# Patient Record
Sex: Male | Born: 1980 | Race: White | Hispanic: No | Marital: Married | Smoking: Current every day smoker
Health system: Southern US, Community
[De-identification: ages and names within clinical notes are randomized; demographics above are authoritative.]

## PROBLEM LIST (undated history)

## (undated) DIAGNOSIS — M549 Dorsalgia, unspecified: Secondary | ICD-10-CM

---

## 2011-02-23 ENCOUNTER — Ambulatory Visit: Payer: Self-pay | Admitting: Unknown Physician Specialty

## 2011-02-23 DIAGNOSIS — I499 Cardiac arrhythmia, unspecified: Secondary | ICD-10-CM

## 2011-02-28 ENCOUNTER — Inpatient Hospital Stay: Payer: Self-pay | Admitting: Unknown Physician Specialty

## 2011-07-04 ENCOUNTER — Ambulatory Visit: Payer: Self-pay | Admitting: Unknown Physician Specialty

## 2011-08-29 ENCOUNTER — Emergency Department: Payer: Self-pay | Admitting: Emergency Medicine

## 2011-10-09 ENCOUNTER — Other Ambulatory Visit: Payer: Self-pay | Admitting: Neurosurgery

## 2011-10-09 DIAGNOSIS — M545 Low back pain: Secondary | ICD-10-CM

## 2011-10-10 ENCOUNTER — Other Ambulatory Visit: Payer: BC Managed Care – PPO

## 2011-10-12 ENCOUNTER — Ambulatory Visit
Admission: RE | Admit: 2011-10-12 | Discharge: 2011-10-12 | Disposition: A | Discharge status: Home / Self Care | Payer: BC Managed Care – PPO | Source: Ambulatory Visit | Attending: Neurosurgery | Admitting: Neurosurgery

## 2011-10-12 VITALS — BP 145/71 | HR 72

## 2011-10-12 DIAGNOSIS — M545 Low back pain: Secondary | ICD-10-CM

## 2011-10-12 MED ORDER — IOHEXOL 180 MG/ML  SOLN
15.0000 mL | Freq: Once | INTRAMUSCULAR | Status: AC | PRN
  Administered 2011-10-12: 15 mL via INTRATHECAL

## 2011-10-12 MED ORDER — ONDANSETRON HCL 4 MG/2ML IJ SOLN: 4.0000 mg | Freq: Four times a day (QID) | INTRAMUSCULAR | Status: DC | PRN

## 2011-10-12 MED ORDER — ONDANSETRON HCL 4 MG/2ML IJ SOLN
4.0000 mg | Freq: Once | INTRAMUSCULAR | Status: AC
  Administered 2011-10-12: 4 mg via INTRAMUSCULAR

## 2011-10-12 MED ORDER — MEPERIDINE HCL 100 MG/ML IJ SOLN
75.0000 mg | Freq: Once | INTRAMUSCULAR | Status: AC
  Administered 2011-10-12: 75 mg via INTRAMUSCULAR

## 2011-10-12 MED ORDER — DIAZEPAM 5 MG PO TABS
10.0000 mg | ORAL_TABLET | Freq: Once | ORAL | Status: AC
  Administered 2011-10-12: 10 mg via ORAL

## 2011-10-12 NOTE — Progress Notes (Addendum)
Explained to pt that it would take 30 minutes for the pain meds to work and to try to get in a more comfortable position. Pt repositioned to side with pillow between his knees. meds given 10 minutes ago.  1420 pt states his pain is less but still has some burning in lower back. Pt states he is ready to go home.

## 2013-12-04 ENCOUNTER — Emergency Department: Payer: Self-pay | Admitting: Emergency Medicine

## 2013-12-04 LAB — BASIC METABOLIC PANEL
ANION GAP: 7 (ref 7–16)
BUN: 9 mg/dL (ref 7–18)
CO2: 29 mmol/L (ref 21–32)
Calcium, Total: 9.6 mg/dL (ref 8.5–10.1)
Chloride: 104 mmol/L (ref 98–107)
Creatinine: 1.52 mg/dL — ABNORMAL HIGH (ref 0.60–1.30)
EGFR (African American): >60
GFR CALC NON AF AMER: 59 — AB
GLUCOSE: 103 mg/dL — AB (ref 65–99)
OSMOLALITY: 278 (ref 275–301)
POTASSIUM: 3.9 mmol/L (ref 3.5–5.1)
Sodium: 140 mmol/L (ref 136–145)

## 2013-12-04 LAB — CBC
HCT: 47.1 % (ref 40.0–52.0)
HGB: 15.5 g/dL (ref 13.0–18.0)
MCH: 29 pg (ref 26.0–34.0)
MCHC: 32.9 g/dL (ref 32.0–36.0)
MCV: 88 fL (ref 80–100)
Platelet: 225 10*3/uL (ref 150–440)
RBC: 5.35 10*6/uL (ref 4.40–5.90)
RDW: 14.8 % — AB (ref 11.5–14.5)
WBC: 14 10*3/uL — ABNORMAL HIGH (ref 3.8–10.6)

## 2013-12-05 LAB — DRUG SCREEN, URINE
AMPHETAMINES, UR SCREEN: NEGATIVE (ref ?–1000)
Barbiturates, Ur Screen: NEGATIVE (ref ?–200)
Benzodiazepine, Ur Scrn: NEGATIVE (ref ?–200)
CANNABINOID 50 NG, UR ~~LOC~~: NEGATIVE (ref ?–50)
COCAINE METABOLITE, UR ~~LOC~~: NEGATIVE (ref ?–300)
MDMA (ECSTASY) UR SCREEN: NEGATIVE (ref ?–500)
Methadone, Ur Screen: NEGATIVE (ref ?–300)
Opiate, Ur Screen: POSITIVE (ref ?–300)
Phencyclidine (PCP) Ur S: NEGATIVE (ref ?–25)
TRICYCLIC, UR SCREEN: POSITIVE (ref ?–1000)

## 2013-12-05 LAB — URINALYSIS, COMPLETE
BACTERIA: NONE SEEN
Bilirubin,UR: NEGATIVE
Blood: NEGATIVE
Glucose,UR: NEGATIVE mg/dL (ref 0–75)
KETONE: NEGATIVE
LEUKOCYTE ESTERASE: NEGATIVE
NITRITE: NEGATIVE
Ph: 8 (ref 4.5–8.0)
Protein: 30
Specific Gravity: 1.021 (ref 1.003–1.030)
Squamous Epithelial: NONE SEEN

## 2013-12-05 LAB — COMPREHENSIVE METABOLIC PANEL
ANION GAP: 7 (ref 7–16)
Albumin: 4 g/dL (ref 3.4–5.0)
Alkaline Phosphatase: 82 U/L
BUN: 11 mg/dL (ref 7–18)
Bilirubin,Total: 0.9 mg/dL (ref 0.2–1.0)
CREATININE: 1.05 mg/dL (ref 0.60–1.30)
Calcium, Total: 8.8 mg/dL (ref 8.5–10.1)
Chloride: 107 mmol/L (ref 98–107)
Co2: 28 mmol/L (ref 21–32)
EGFR (African American): >60
EGFR (Non-African Amer.): >60
GLUCOSE: 80 mg/dL (ref 65–99)
OSMOLALITY: 281 (ref 275–301)
Potassium: 3.9 mmol/L (ref 3.5–5.1)
SGOT(AST): 28 U/L (ref 15–37)
SGPT (ALT): 22 U/L
SODIUM: 142 mmol/L (ref 136–145)
TOTAL PROTEIN: 7.3 g/dL (ref 6.4–8.2)

## 2013-12-05 LAB — ACETAMINOPHEN LEVEL

## 2014-07-18 NOTE — Consult Note (Signed)
PATIENT NAME:  Kristopher Castillo, Kristopher Castillo MR#:  [id number] DATE OF BIRTH:  Aug 25, 1980  DATE OF CONSULTATION:  12/05/2013  REFERRING PHYSICIAN:   CONSULTING PHYSICIAN:  Audery Amel, MD  IDENTIFYING INFORMATION AND REASON FOR CONSULTATION: A 34 year old man with chronic pain who came in delirious last night. Consultation to evaluate dangerousness.   HISTORY OF PRESENT ILLNESS: Information obtained from the patient and the chart. The patient came into the hospital last night delirious, incoherent. Was not reporting any suicidal ideation. Appeared to have taken excessive medication. Unclear what it was. Did not appear to be at any great physical danger. He has slept it off as of today. The patient tells me that what he remembers is yesterday his back was hurting particularly bad so he took some baclofen which belongs to his wife. He estimates that he took 4 to 6 of the baclofen tablets. This is on top of his usual narcotic pain medicine. He does not remember anything after that. The patient denies that he was drinking alcohol or using any other drugs. Denies any suicidal ideation. Denies any suicidal intent. Denies any mood symptoms or psychotic symptoms.   PAST PSYCHIATRIC HISTORY: No history of hospitalization. No history of suicide attempts or violence. No history of psychosis. Not been on any psychiatric medicine. No psychiatric diagnoses.   SUBSTANCE ABUSE HISTORY: Does not drink and says he does not use any abusable drugs or abuse his prescription medicine. No clear past history of substance abuse identified.   PAST MEDICAL HISTORY: The patient has chronic pain related to a work related accident from some years ago. He sees a pain specialist in [place]. Also apparently has high blood pressure.   SOCIAL HISTORY: Lives with his wife and a child. Not able to work. Engaged in working through disability. Says he gets along fine with his family, has no social complaints.   CURRENT MEDICATIONS: Hydrocodone  10 mg/325 mg six of them a day, morphine sulfate 300 mg 3 times a day, gabapentin 300 mg p.r.n., hydroxyzine 50 mg p.r.n., also hydrochlorothiazide and trazodone p.r.n.   ALLERGIES: TRAMADOL.   REVIEW OF SYSTEMS: Chronic back pain. No suicidal ideation. No depression. No panic attacks. No hallucinations. The rest of the whole nine-point review of systems is negative.   MENTAL STATUS EXAMINATION: Slightly disheveled gentleman who is alert and oriented x4. Interactive, appropriate. Speech is normal rate, tone and volume. Affect is euthymic. Psychomotor activity normal. Mood stated as being fine. Thoughts appear to be lucid without any loosening of associations or delusions. Denies auditory or visual hallucinations. Denies any suicidal or homicidal ideation. Can recall 3 out of 3 objects immediately and at three minutes. Long-term memory intact. Fund of knowledge normal.   VITAL SIGNS: His most recent blood pressure 144/88, pulse 104, temperature 98.9, respirations 18.   LABORATORY RESULTS: Admission labs only included a basic chemistry and CBC. Creatinine slightly elevated at 1.52. Hematology panel: Elevated white count 14.   Labs repeated today: His creatinine is down to 1.85. No chemistry abnormalities. Drug screen positive for tricyclics and opiates. Urinalysis normal.   ASSESSMENT: A 34 year old man who appears to have had an accidental overdose on baclofen when taken on top of pain medicine. There does not appear to be any indication that he was trying to harm himself. The patient's delirium has cleared and there is no acute psychiatric problem.   TREATMENT PLAN: Education was done about the use of any sedating medicine including muscle relaxants on top of narcotics. Reviewed the  dangers of all of the medicines that he is taking and the importance of only making any changes to his medicine regimen in consultation with his physician. No indication for further psychiatric treatment. Reviewed his  prescriptions on line to confirm that they are correct. The patient can be taken off of IVC and released from the Emergency Room. No psychiatric follow-up required.   DIAGNOSIS, PRINCIPAL AND PRIMARY:  AXIS I: Delirium due to medications, resolved.   SECONDARY DIAGNOSES: AXIS I: No further.   ____________________________ Audery AmelJohn T. Clapacs, MD jtc:sb D: 12/05/2013 14:15:29 ET T: 12/05/2013 14:37:02 ET JOB#: [id number]  cc: Audery AmelJohn T. Clapacs, MD, <Dictator> Audery AmelJOHN T CLAPACS MD ELECTRONICALLY SIGNED 12/06/2013 22:23

## 2014-07-18 NOTE — Consult Note (Signed)
Brief Consult Note: Diagnosis: delirium from medication. Resolved.   Patient was seen by consultant.   Consult note dictated.   Discussed with Attending MD.   Comments: PSychiatry: PAtient seen. Currently lucid and alert and denies any mood symptoms and denies any suicidal ideation. Appears to have been an accidental od. EDucation done. DC the IVC and can be released f4rom the ER.  Electronic Signatures: Clapacs, Jackquline DenmarkJohn T (MD)  (Signed 11-Sep-15 14:10)  Authored: Brief Consult Note   Last Updated: 11-Sep-15 14:10 by Audery Amellapacs, John T (MD)

## 2014-07-19 NOTE — Op Note (Signed)
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   PROCEDURES PERFORMED:  1. Posterolateral fusion L5-S1.  2. Insertion of interbody device L5-S1.  3. Nonsegmental spinal instrumentation L5-S1.  4. Aspiration of iliac crest bone marrow aspirate for fusion.   SURGEON: Kristopher JockJames C. Gerrit Heckaliff, MD  ASSISTANT: Abby PotashLindsey Reese, PA-C.   ANESTHESIA: General.   ESTIMATED BLOOD LOSS: 50 mL.  REPLACEMENT: 375 mL of Cell Saver and 1400 mL crystalloid.   DRAINS: None.   COMPLICATIONS: None.   IMPLANTS USED: Zimmer ST 360 pedicle screws, Ardis trabecular metal 6 x 26 x 9 mm interbody device, CopiOs bone void filler.   BRIEF CLINICAL NOTE AND PATHOLOGY: The patient had persistent back pain with radiculopathy, documented spondylolisthesis L5-S1. He failed conservative treatment. Options, risks, and benefits were discussed in detail. At time of the procedure, patient's bone was of good quality. There was marked stenosis. No other abnormalities were noted.   DESCRIPTION OF PROCEDURE: Preop antibiotics, adequate general anesthesia, patient turned to prone position on radiolucent table, all prominences well padded. Routine prepping and draping. Appropriate timeout was called. AP and lateral fluoroscopic views were obtained and pedicles appropriately aligned.   Routine posterior approach was made. The fascia opened in line with the incision. Subperiosteal dissection was performed. Self-retaining retractor was used and replaced regularly throughout the procedure.   Laminectomy was performed at L5-S1 with partial facetectomy on the right side, facetectomy and foraminotomy on the left side. Nerve roots were decompressed.   The L5 and S1 nerve roots were carefully  identified and protected while the disk space was entered through the left side. Sizing was performed and cleaning of the endplates was performed using fluoroscopic guidance. Some CopiOs was then placed anteriorly and interbody device was inserted after the trial was tried. The device seated very nicely.   The pedicles were then instrumented in routine fashion using the bur followed up by the electronic pedicle probe to check for breaching. The screws were inserted in routine fashion under fluoroscopic guidance. All had good purchase. AP and lateral views showed good positioning.   The transverse process of L5 was decorticated on each side. The sacral ala was decorticated.   Attention then turned to the right iliac crest were using a bone marrow aspirate needle bone marrow aspirate was obtained. This was then mixed with CopiOs and bone which had been placed in the bone mill and the posterolateral fusion mass was placed. A top loading rod was placed on the right side and on the left side ST 360 rod was placed due tot he screw heads being too close for the top loading rod. Decompression was performed. The incision was thoroughly irrigated. Hemostasis was good. The incision again thoroughly irrigated. Spinal cord was inspected, no further pressure was noted. Fascia was closed with #2 quill, sub-Q closed with 0 quill, skin closed with staples. Soft sterile dressing was applied. Sponge and needle counts reported as correct prior to and after wound closure. Patient was awakened, taken to the PAC-U having tolerated the procedure well.  ____________________________ Kristopher JockJames C. Gerrit Heckaliff, MD jcc:cms D: 02/28/2011 20:45:59 ET T: 03/01/2011 09:23:52 ET  JOB#: [id number] cc: Kristopher JockJames C. Gerrit Heckaliff, MD, <Dictator> Kristopher JockJAMES C Garnett Nunziata MD ELECTRONICALLY SIGNED 04/13/2011 8:11

## 2018-12-07 ENCOUNTER — Emergency Department
Admission: EM | Admit: 2018-12-07 | Discharge: 2018-12-07 | Disposition: A | Discharge status: Home / Self Care | Payer: Medicare Other | Attending: Student | Admitting: Student

## 2018-12-07 ENCOUNTER — Other Ambulatory Visit: Payer: Self-pay

## 2018-12-07 ENCOUNTER — Emergency Department: Payer: Medicare Other

## 2018-12-07 DIAGNOSIS — Z23 Encounter for immunization: Secondary | ICD-10-CM | POA: Diagnosis not present

## 2018-12-07 DIAGNOSIS — F17210 Nicotine dependence, cigarettes, uncomplicated: Secondary | ICD-10-CM | POA: Diagnosis not present

## 2018-12-07 DIAGNOSIS — Y92512 Supermarket, store or market as the place of occurrence of the external cause: Secondary | ICD-10-CM | POA: Insufficient documentation

## 2018-12-07 DIAGNOSIS — R569 Unspecified convulsions: Secondary | ICD-10-CM | POA: Insufficient documentation

## 2018-12-07 DIAGNOSIS — Y9389 Activity, other specified: Secondary | ICD-10-CM | POA: Diagnosis not present

## 2018-12-07 DIAGNOSIS — S0001XA Abrasion of scalp, initial encounter: Secondary | ICD-10-CM | POA: Insufficient documentation

## 2018-12-07 DIAGNOSIS — W19XXXA Unspecified fall, initial encounter: Secondary | ICD-10-CM | POA: Diagnosis not present

## 2018-12-07 DIAGNOSIS — Y999 Unspecified external cause status: Secondary | ICD-10-CM | POA: Insufficient documentation

## 2018-12-07 HISTORY — DX: Dorsalgia, unspecified: M54.9

## 2018-12-07 LAB — URINALYSIS, COMPLETE (UACMP) WITH MICROSCOPIC
Bacteria, UA: NONE SEEN
Glucose, UA: NEGATIVE mg/dL
Hgb urine dipstick: NEGATIVE
Ketones, ur: 5 mg/dL — AB
Leukocytes,Ua: NEGATIVE
Nitrite: NEGATIVE
Protein, ur: 100 mg/dL — AB
Specific Gravity, Urine: 1.028 (ref 1.005–1.030)
Squamous Epithelial / LPF: NONE SEEN (ref 0–5)
pH: 5 (ref 5.0–8.0)

## 2018-12-07 LAB — URINE DRUG SCREEN, QUALITATIVE (ARMC ONLY)
Amphetamines, Ur Screen: NOT DETECTED
Barbiturates, Ur Screen: NOT DETECTED
Benzodiazepine, Ur Scrn: POSITIVE — AB
Cannabinoid 50 Ng, Ur ~~LOC~~: NOT DETECTED
Cocaine Metabolite,Ur ~~LOC~~: NOT DETECTED
MDMA (Ecstasy)Ur Screen: NOT DETECTED
Methadone Scn, Ur: NOT DETECTED
Opiate, Ur Screen: NOT DETECTED
Phencyclidine (PCP) Ur S: NOT DETECTED
Tricyclic, Ur Screen: NOT DETECTED

## 2018-12-07 LAB — BASIC METABOLIC PANEL
Anion gap: 9 (ref 5–15)
BUN: 11 mg/dL (ref 6–20)
CO2: 29 mmol/L (ref 22–32)
Calcium: 9 mg/dL (ref 8.9–10.3)
Chloride: 101 mmol/L (ref 98–111)
Creatinine, Ser: 0.96 mg/dL (ref 0.61–1.24)
GFR calc Af Amer: >60 mL/min (ref >60)
GFR calc non Af Amer: >60 mL/min (ref >60)
Glucose, Bld: 87 mg/dL (ref 70–99)
Potassium: 4.2 mmol/L (ref 3.5–5.1)
Sodium: 139 mmol/L (ref 135–145)

## 2018-12-07 LAB — CBC WITH DIFFERENTIAL/PLATELET
Abs Immature Granulocytes: 0.02 10*3/uL (ref 0.00–0.07)
Basophils Absolute: 0 10*3/uL (ref 0.0–0.1)
Basophils Relative: 1 %
Eosinophils Absolute: 0.1 10*3/uL (ref 0.0–0.5)
Eosinophils Relative: 2 %
HCT: 44.6 % (ref 39.0–52.0)
Hemoglobin: 14.6 g/dL (ref 13.0–17.0)
Immature Granulocytes: 0 %
Lymphocytes Relative: 20 %
Lymphs Abs: 1.4 10*3/uL (ref 0.7–4.0)
MCH: 28.9 pg (ref 26.0–34.0)
MCHC: 32.7 g/dL (ref 30.0–36.0)
MCV: 88.1 fL (ref 80.0–100.0)
Monocytes Absolute: 0.4 10*3/uL (ref 0.1–1.0)
Monocytes Relative: 6 %
Neutro Abs: 4.8 10*3/uL (ref 1.7–7.7)
Neutrophils Relative %: 71 %
Platelets: 195 10*3/uL (ref 150–400)
RBC: 5.06 MIL/uL (ref 4.22–5.81)
RDW: 14.4 % (ref 11.5–15.5)
WBC: 6.7 10*3/uL (ref 4.0–10.5)
nRBC: 0 % (ref 0.0–0.2)

## 2018-12-07 LAB — ETHANOL: Alcohol, Ethyl (B): <10 mg/dL (ref <10)

## 2018-12-07 LAB — GLUCOSE, CAPILLARY: Glucose-Capillary: 89 mg/dL (ref 70–99)

## 2018-12-07 MED ORDER — TETANUS-DIPHTH-ACELL PERTUSSIS 5-2.5-18.5 LF-MCG/0.5 IM SUSP
0.5000 mL | Freq: Once | INTRAMUSCULAR | Status: AC
  Administered 2018-12-07: 0.5 mL via INTRAMUSCULAR
  Filled 2018-12-07: qty 0.5

## 2018-12-07 NOTE — ED Triage Notes (Signed)
Reports having syncopal vs seizure today  While shopping in [place]. Patient aox4 upon arrival, denies hx of sz. Presents with lip lac and abrasion to top of head. No active bleeding note. No incontinence noted.

## 2018-12-07 NOTE — ED Provider Notes (Addendum)
[facility] Emergency Department Provider Note  ____________________________________________   First MD Initiated Contact with Patient 12/07/18 1520     (approximate)  I have reviewed the triage vital signs and the nursing notes.  History  Chief Complaint Near Syncope    HPI Kristopher Castillo is a 38 y.o. male on Suboxone, who presents to the emergency department with concern for seizure-like activity.  Per patient, he remembers being at the grocery store when he began feelings lightheaded/dizzy. He knelt to the ground and then felt his body start to tense up. After this, the next thing he remembers is being on the ground, surrounded by EMS. He had a bite to his lip and an abrasion to the top of his head. No incontinence. He states upon waking he was confused as to what happened, but remembered he was in the grocery store.   Per mother and EMS, they described a tonic like reaction, arms flexed and head tensed backward. Body seemed stiff and tense. No shaking. Lasted maybe a few minutes. He was confused for a short period afterwards.   Patient denies any hx of prior seizures. Denies alcohol use. No hx of arrhythmias. Feels well now on arrival.      Aside from Suboxone, is also on gabapentin, duloxetine, mirtazapine and/or Trazodone at night for sleep. He does sometimes take benzodiazepines, but does not have a Rx for this. Last benzodiazepine use was at least one week ago. States that he sometimes takes it for a week or two at a time for anxiety or trouble sleeping.      Past Medical Hx Past Medical History:  Diagnosis Date   Back pain    chronic back pain    Problem List There are no active problems to display for this patient.   Past Surgical Hx History reviewed. No pertinent surgical history.  Medications Prior to Admission medications   Not on File    Allergies Tramadol  Family Hx History reviewed. No pertinent family  history.  Social Hx Social History   Tobacco Use   Smoking status: Current Every Day Smoker   Smokeless tobacco: Current User  Substance Use Topics   Alcohol use: Yes   Drug use: Never     Review of Systems  Constitutional: Negative for fever. Negative for chills. Eyes: Negative for visual changes. ENT: Negative for sore throat. Cardiovascular: Negative for chest pain. Respiratory: Negative for shortness of breath. Gastrointestinal: Negative for abdominal pain. Negative for nausea. Negative for vomiting. Genitourinary: Negative for dysuria. Musculoskeletal: Negative for leg swelling. Skin: Negative for rash. Neurological: Negative for for headaches. + seizure like activity   Physical Exam  Vital Signs: ED Triage Vitals  Enc Vitals Group     BP 12/07/18 1527 131/85     Pulse Rate 12/07/18 1527 84     Resp 12/07/18 1527 17     Temp --      Temp src --      SpO2 12/07/18 1527 95 %     Weight 12/07/18 1528 175 lb (79.4 kg)     Height 12/07/18 1528 5\' 9"  (1.753 m)     Head Circumference --      Peak Flow --      Pain Score 12/07/18 1528 0     Pain Loc --      Pain Edu? --      Excl. in GC? --     Constitutional: Alert and oriented.  Eyes: Conjunctivae clear. Sclera  anicteric. Head: Normocephalic.  Abrasion to top of the head, no lacerations, hemostatic. Nose: No congestion. No rhinorrhea. Mouth/Throat: Mucous membranes are moist.  Small bite to the lower, middle lip, hemostatic, superficial, does not cross vermilion border Neck: No stridor.   Cardiovascular: Normal rate, regular rhythm. No murmurs. Extremities well perfused. Respiratory: Normal respiratory effort.  Lungs CTAB. Gastrointestinal: Soft and non-tender. No distention.  Musculoskeletal: No lower extremity edema. Neurologic:  Normal speech and language. No gross focal neurologic deficits are appreciated. Alert and oriented.  Face symmetric.  Tongue midline.  Cranial nerves II through XII intact. UE  and LE strength 5/5 and symmetric. UE and LE SILT.  Skin: As above. Psychiatric: Mood and affect are appropriate for situation.  EKG  Personally reviewed.   Rate: 82 Rhythm: sinus Axis: borderline right Intervals: WNL No acute ischemic changes No evidence of Brugada, Wolff-Parkinson-White, or prolonged QTC No STEMI    Radiology  CT head: IMPRESSION: No acute findings.     Procedures  Procedure(s) performed (including critical care):  Procedures   Initial Impression / Assessment and Plan / ED Course  38 y.o. male who presents to the ED who presents for seizure-like activity, as described above.  Ddx: new onset seizures, electrolyte derangements, arrhythmia, substance or withdrawal related  Plan: labs, urine, UDS, imaging. Lip does not require repair (not thru and thru, no vermilion border involvement, no significant defect).  Labs without actionable derangements.  Normal electrolytes. No UTI.  UDS positive for benzodiazepines - patient states last use was 1 week ago (perhaps false negative from his other medications, or patient took more recently than reported).  CT head is negative. EKG w/o evidence of arrhythmia. Given otherwise negative work up, and first time event (do not feel starting AEDs is indicated), will plan for discharge with Neurology follow up. Discussed seizure precautions (including no driving until cleared) - patient and mother at bedside voice understanding. Discussed proper benzodiazepine use, only taking his prescribed medications. Given return precautions.     Final Clinical Impression(s) / ED Diagnosis  Final diagnoses:  Seizure-like activity [facility])       Note:  This document was prepared using Dragon voice recognition software and may include unintentional dictation errors.   Lilia Pro., MD 12/07/18 2354    Lilia Pro., MD 12/08/18 [phone]

## 2018-12-07 NOTE — ED Notes (Signed)
Off unit to ct 

## 2018-12-07 NOTE — Discharge Instructions (Signed)
Thank you for letting us take care of you in the emergency department today.   Please continue to take any regular, prescribed medications.  Please avoid taking benzodiazepines.  Please be sure to follow the seizure precautions we have discussed until you are cleared by your doctor.  Please follow up with: - Neurology doctor, information is provided below, or you can discuss referral with your primary care doctor - Your primary care doctor to review your ER visit and follow up on your symptoms.   Please return to the ER for any new or worsening symptoms.

## 2021-04-10 IMAGING — CT CT HEAD W/O CM
3 series · 16 of 47 positions shown, 19 images · non-contrast
Comparison: None.

CLINICAL DATA: Altered mental status. Syncope versus seizure today
while shopping. Lip laceration and abrasion to top of head.

EXAM:
CT HEAD WITHOUT CONTRAST
TECHNIQUE: Contiguous axial images were obtained from the base of the skull
through the vertex without intravenous contrast.

[Series 2: head wo · axial · 0.42mm/px · z∈[-128,-3]mm · 10 of 30 slices shown, 13 images]
[im 3/30  brain]
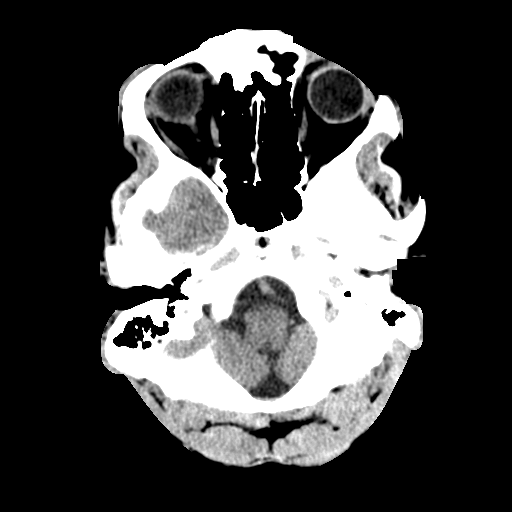
[im 3/30  bone]
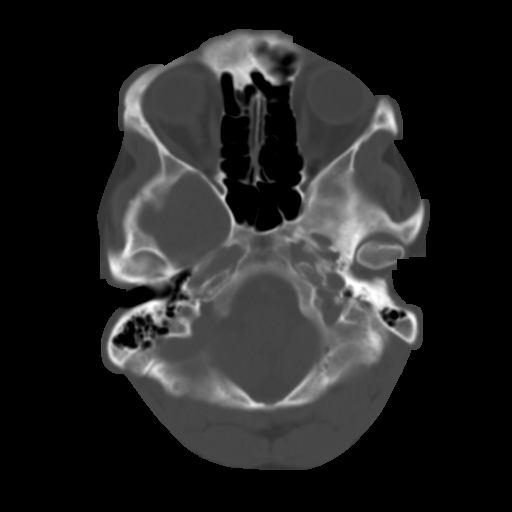
[im 6/30  brain]
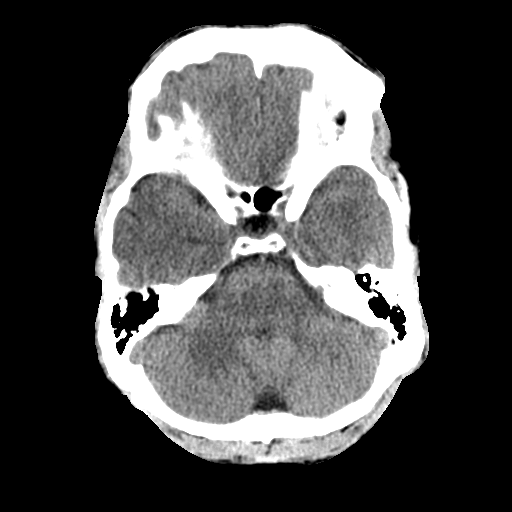
[im 9/30  brain]
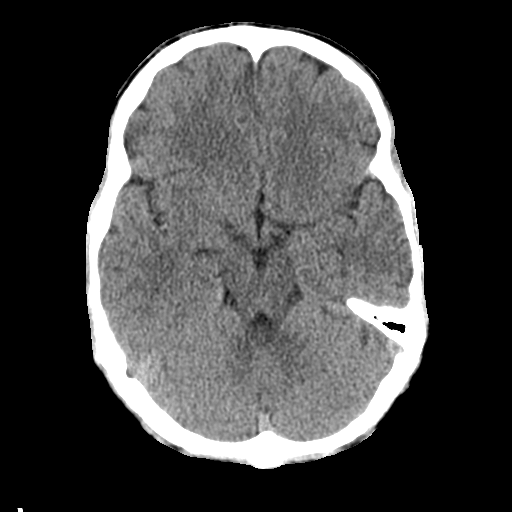
[im 11/30  brain]
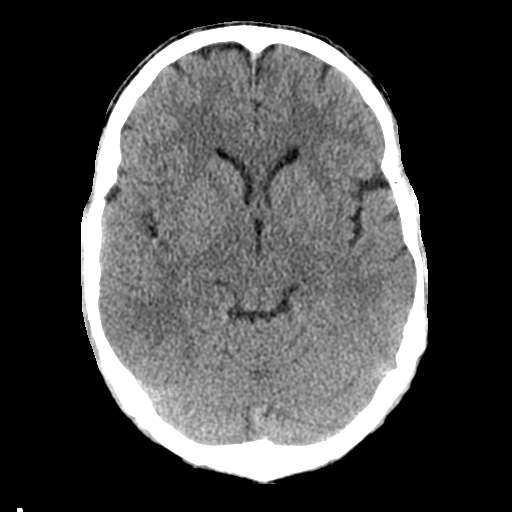
[im 14/30  brain]
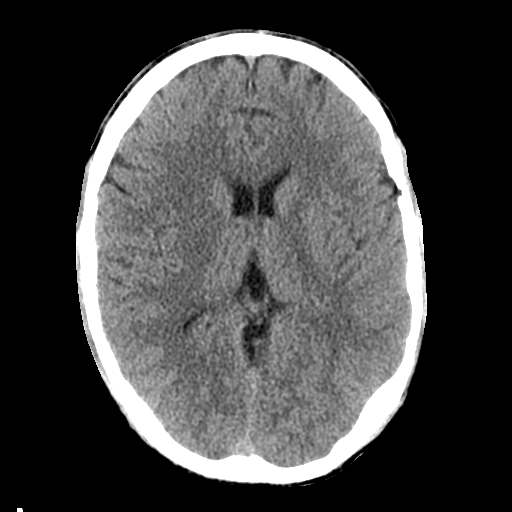
[im 14/30  bone]
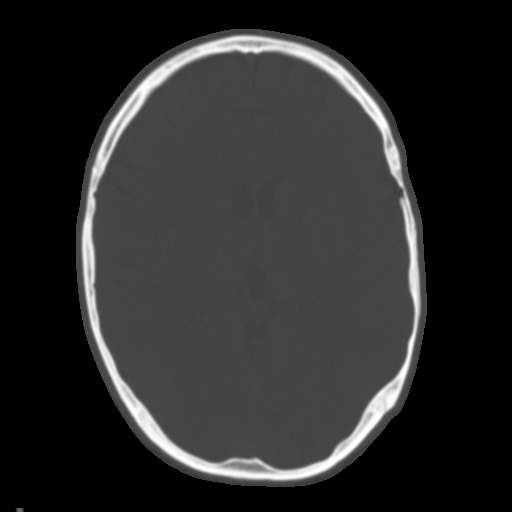
[im 17/30  brain]
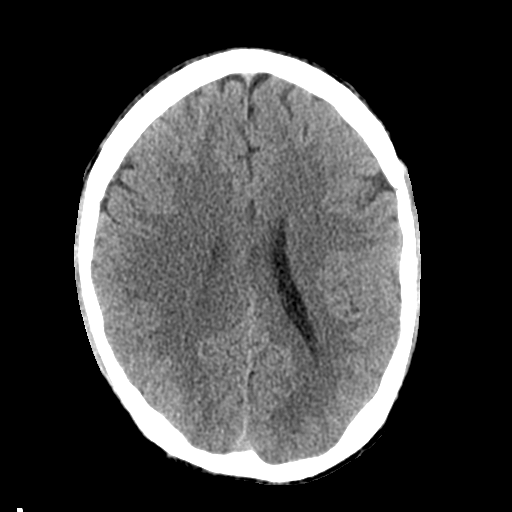
[im 20/30  brain]
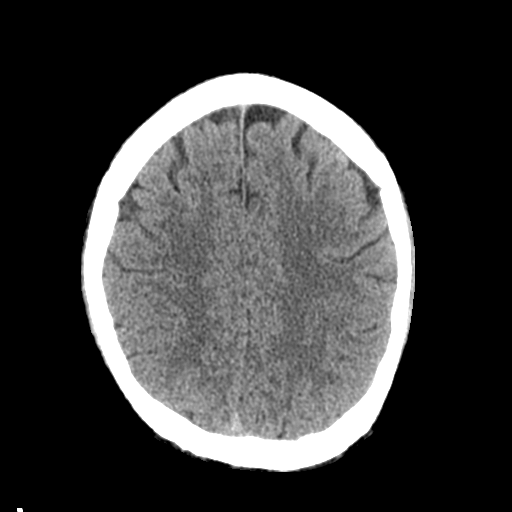
[im 23/30  brain]
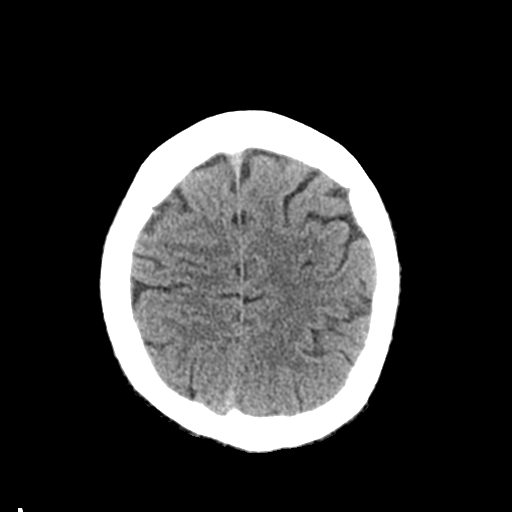
[im 25/30  brain]
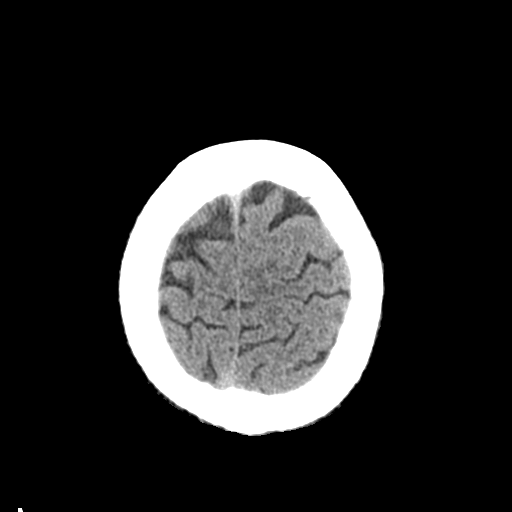
[im 25/30  bone]
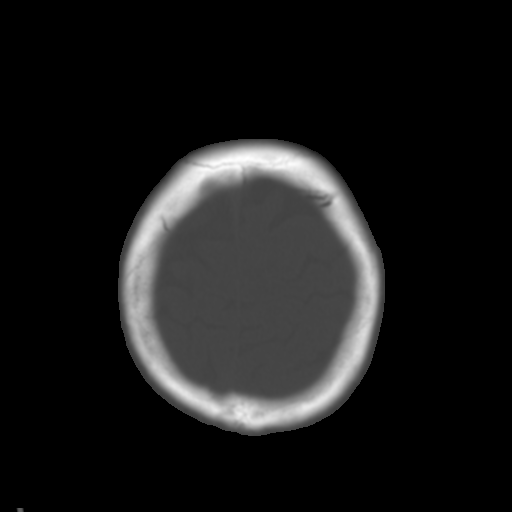
[im 28/30  brain]
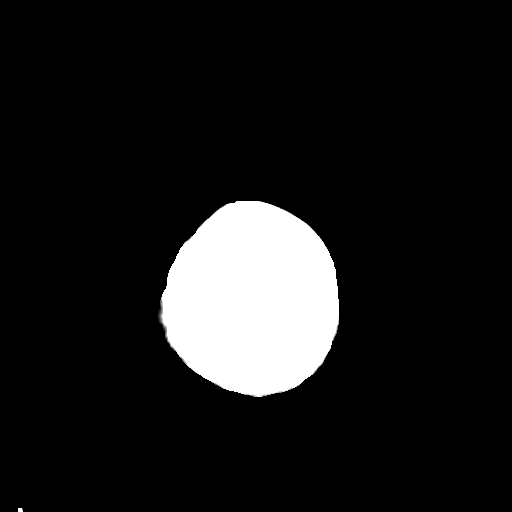

[Series 4: coronal soft tissue · coronal · 0.30mm/px · 3 of 67 slices shown]
[im 23/67  brain]
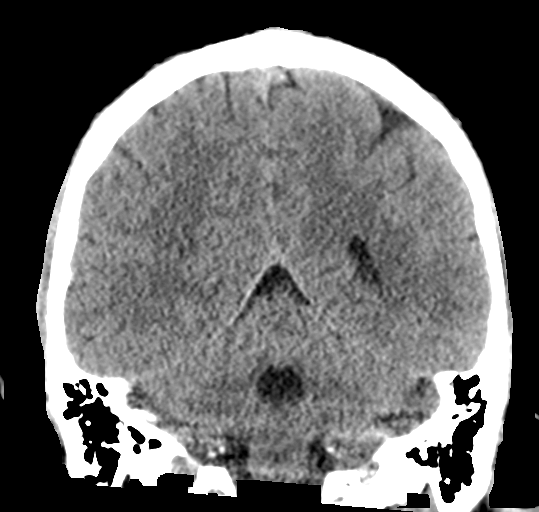
[im 30/67  brain]
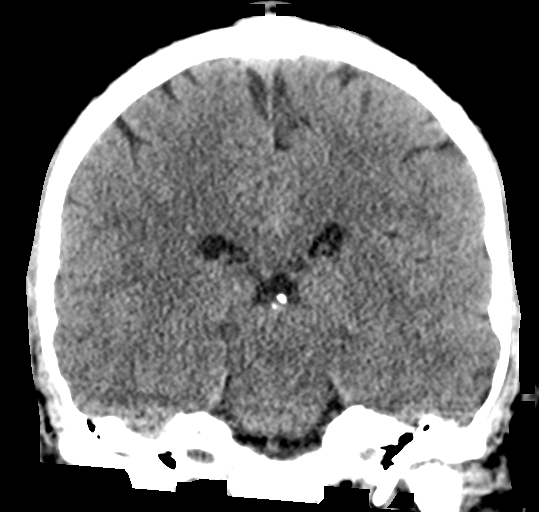
[im 37/67  brain]
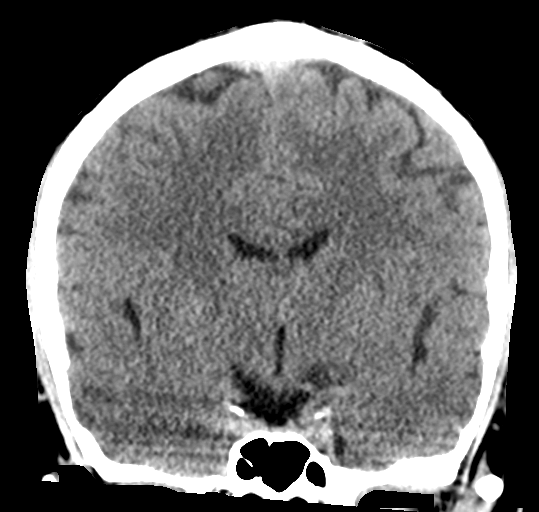

[Series 5: sagittal soft tissue · sagittal · 0.30mm/px · 3 of 54 slices shown]
[im 18/54  brain]
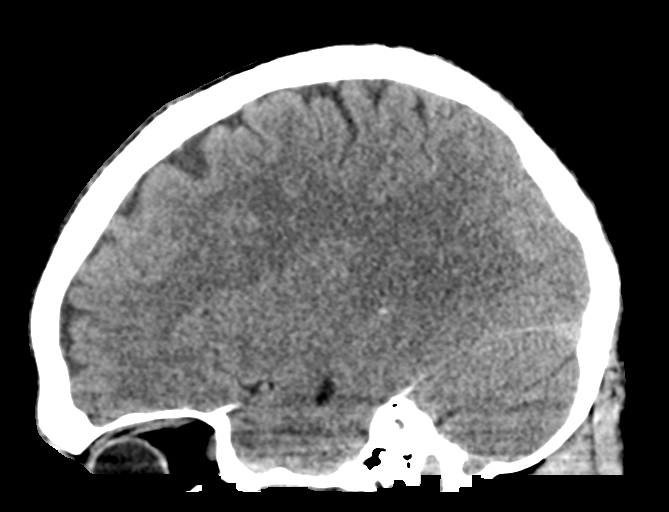
[im 27/54  brain]
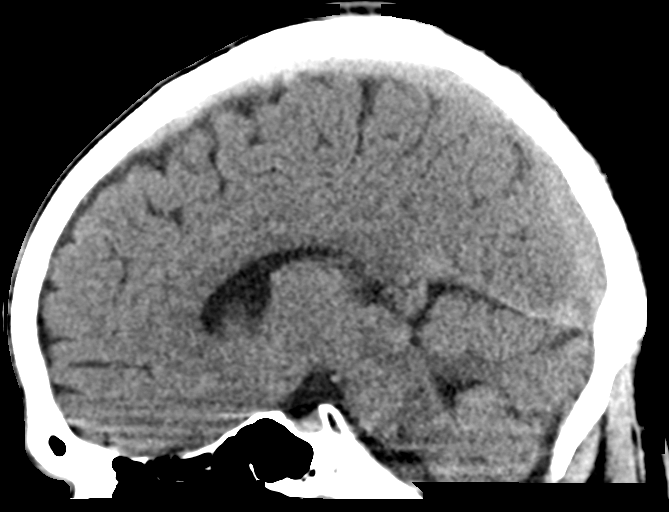
[im 36/54  brain]
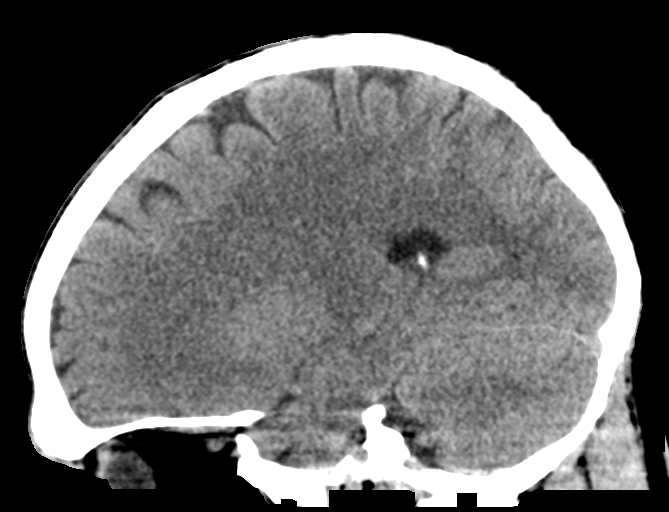

[16 of 47 positions shown; findings below may reference images not displayed]

FINDINGS: Brain: Ventricles, cisterns and other CSF spaces are normal. No
mass, mass effect, shift of midline structures or acute hemorrhage.
No evidence of acute infarction.

Vascular: No hyperdense vessel or unexpected calcification.

Skull: Normal. Negative for fracture or focal lesion.

Sinuses/Orbits: No acute finding.

Other: None.
IMPRESSION: No acute findings.

## 2021-09-27 ENCOUNTER — Other Ambulatory Visit: Payer: Self-pay

## 2021-09-27 ENCOUNTER — Emergency Department
Admission: EM | Admit: 2021-09-27 | Discharge: 2021-09-27 | Disposition: A | Discharge status: Home / Self Care | Payer: Medicare Other | Attending: Student in an Organized Health Care Education/Training Program | Admitting: Student in an Organized Health Care Education/Training Program

## 2021-09-27 DIAGNOSIS — F1910 Other psychoactive substance abuse, uncomplicated: Secondary | ICD-10-CM | POA: Diagnosis not present

## 2021-09-27 DIAGNOSIS — G8929 Other chronic pain: Secondary | ICD-10-CM | POA: Diagnosis not present

## 2021-09-27 DIAGNOSIS — Z046 Encounter for general psychiatric examination, requested by authority: Secondary | ICD-10-CM | POA: Diagnosis present

## 2021-09-27 LAB — COMPREHENSIVE METABOLIC PANEL
ALT: 14 U/L (ref 0–44)
AST: 22 U/L (ref 15–41)
Albumin: 4.4 g/dL (ref 3.5–5.0)
Alkaline Phosphatase: 76 U/L (ref 38–126)
Anion gap: 9 (ref 5–15)
BUN: 20 mg/dL (ref 6–20)
CO2: 27 mmol/L (ref 22–32)
Calcium: 9.2 mg/dL (ref 8.9–10.3)
Chloride: 104 mmol/L (ref 98–111)
Creatinine, Ser: 0.95 mg/dL (ref 0.61–1.24)
GFR, Estimated: >60 mL/min (ref >60)
Glucose, Bld: 113 mg/dL — ABNORMAL HIGH (ref 70–99)
Potassium: 4 mmol/L (ref 3.5–5.1)
Sodium: 140 mmol/L (ref 135–145)
Total Bilirubin: 0.5 mg/dL (ref 0.3–1.2)
Total Protein: 7.2 g/dL (ref 6.5–8.1)

## 2021-09-27 LAB — CBC
HCT: 37.9 % — ABNORMAL LOW (ref 39.0–52.0)
Hemoglobin: 12.2 g/dL — ABNORMAL LOW (ref 13.0–17.0)
MCH: 27.7 pg (ref 26.0–34.0)
MCHC: 32.2 g/dL (ref 30.0–36.0)
MCV: 85.9 fL (ref 80.0–100.0)
Platelets: 204 10*3/uL (ref 150–400)
RBC: 4.41 MIL/uL (ref 4.22–5.81)
RDW: 15.2 % (ref 11.5–15.5)
WBC: 8.3 10*3/uL (ref 4.0–10.5)
nRBC: 0 % (ref 0.0–0.2)

## 2021-09-27 LAB — URINE DRUG SCREEN, QUALITATIVE (ARMC ONLY)
Amphetamines, Ur Screen: NOT DETECTED
Barbiturates, Ur Screen: NOT DETECTED
Benzodiazepine, Ur Scrn: POSITIVE — AB
Cannabinoid 50 Ng, Ur ~~LOC~~: NOT DETECTED
Cocaine Metabolite,Ur ~~LOC~~: POSITIVE — AB
MDMA (Ecstasy)Ur Screen: NOT DETECTED
Methadone Scn, Ur: NOT DETECTED
Opiate, Ur Screen: NOT DETECTED
Phencyclidine (PCP) Ur S: NOT DETECTED
Tricyclic, Ur Screen: NOT DETECTED

## 2021-09-27 LAB — ETHANOL: Alcohol, Ethyl (B): <10 mg/dL (ref <10)

## 2021-09-27 LAB — SALICYLATE LEVEL: Salicylate Lvl: <7 mg/dL — ABNORMAL LOW (ref 7.0–30.0)

## 2021-09-27 LAB — ACETAMINOPHEN LEVEL: Acetaminophen (Tylenol), Serum: <10 ug/mL — ABNORMAL LOW (ref 10–30)

## 2021-09-27 NOTE — ED Triage Notes (Signed)
Pt here with [facility] under IVC. Paperwork taken out by pt's mother due to her son's drug problem. Mother states pt is unable to care for himself and she is afraid that he will overdose. Pt takes xanax, cocaine, and Suboxone.

## 2021-09-27 NOTE — ED Notes (Signed)
pt is a/o x3 denies all si/hi stated he has no a/v. all belongs accounted for and returned to pt.

## 2021-09-27 NOTE — ED Provider Notes (Signed)
[facility] Provider Note    Event Date/Time   First MD Initiated Contact with Patient 09/27/21 1503     (approximate)   History   IVC   HPI  Kristopher Castillo is a 41 y.o. male admitted history of substance abuse presents to the ER for psychiatric evaluation after family took out IVC paperwork on him due to his substance abuse history.  Patient denies any SI or HI.  States that he self medicates due to chronic back pain.  He has not no intent to harm himself or others.  Denies any other acute complaints.     Physical Exam   Triage Vital Signs: ED Triage Vitals [09/27/21 1406]  Enc Vitals Group     BP 131/84     Pulse Rate 97     Resp 18     Temp 98.5 F (36.9 C)     Temp Source Oral     SpO2 96 %     Weight 175 lb 0.7 oz (79.4 kg)     Height 5\' 9"  (1.753 m)     Head Circumference      Peak Flow      Pain Score 8     Pain Loc      Pain Edu?      Excl. in GC?     Most recent vital signs: Vitals:   09/27/21 1406  BP: 131/84  Pulse: 97  Resp: 18  Temp: 98.5 F (36.9 C)  SpO2: 96%     Constitutional: Alert  Eyes: Conjunctivae are normal.  Head: Atraumatic. Nose: No congestion/rhinnorhea. Mouth/Throat: Mucous membranes are moist.   Neck: Painless ROM.  Cardiovascular:   Good peripheral circulation. Respiratory: Normal respiratory effort.  No retractions.  Gastrointestinal: Soft and nontender.  Musculoskeletal:  no deformity Neurologic:  MAE spontaneously. No gross focal neurologic deficits are appreciated.  Skin:  Skin is warm, dry and intact. No rash noted. Psychiatric: Mood and affect are normal. Speech and behavior are normal.    ED Results / Procedures / Treatments   Labs (all labs ordered are listed, but only abnormal results are displayed) Labs Reviewed  COMPREHENSIVE METABOLIC PANEL - Abnormal; Notable for the following components:      Result Value   Glucose, Bld 113 (*)    All other components within normal  limits  SALICYLATE LEVEL - Abnormal; Notable for the following components:   Salicylate Lvl <7.0 (*)    All other components within normal limits  ACETAMINOPHEN LEVEL - Abnormal; Notable for the following components:   Acetaminophen (Tylenol), Serum <10 (*)    All other components within normal limits  CBC - Abnormal; Notable for the following components:   Hemoglobin 12.2 (*)    HCT 37.9 (*)    All other components within normal limits  URINE DRUG SCREEN, QUALITATIVE ([facility] ONLY) - Abnormal; Notable for the following components:   Cocaine Metabolite,Ur [place] POSITIVE (*)    Benzodiazepine, Ur Scrn POSITIVE (*)    All other components within normal limits  ETHANOL     EKG     RADIOLOGY    PROCEDURES:  Critical Care performed:   Procedures   MEDICATIONS ORDERED IN ED: Medications - No data to display   IMPRESSION / MDM / ASSESSMENT AND PLAN / ED COURSE  I reviewed the triage vital signs and the nursing notes.  Differential diagnosis includes, but is not limited to, Psychosis, delirium, medication effect, noncompliance, polysubstance abuse, Si, Hi, depression  Patient with history of substance use disorder presents to the ER under IVC for substance abuse concerns.  Patient denies any SI or HI.  Does not meet criteria for IVC to be continued.  Patient evaluated by psychiatry not felt to require inpatient psychiatric treatment.  Patient will be given referral to substance abuse counseling and treatment.  No indication for medical admission.     FINAL CLINICAL IMPRESSION(S) / ED DIAGNOSES   Final diagnoses:  Substance abuse (HCC)     Rx / DC Orders   ED Discharge Orders     None        Note:  This document was prepared using Dragon voice recognition software and may include unintentional dictation errors.    Willy Eddy, MD 09/27/21 [phone]

## 2021-09-27 NOTE — Consult Note (Signed)
[facility] Face-to-Face Psychiatry Consult   Reason for Consult:  IVC'd by father for "drug problem" Referring Physician:  Roxan Hockey Patient Identification: Kristopher Castillo MRN:  [id number] Principal Diagnosis: Substance abuse [facility]) Diagnosis:  Principal Problem:   Substance abuse (HCC)   Total Time spent with patient: 30 minutes  Subjective:   Kristopher Castillo is a 41 y.o. male patient admitted with IVC by parent for "drug problem.".  HPI:  Patient presents to ED under IVC by parent d/t a "drug problem." Per IVC paperwork, parent states that patient gets his disability check at the first of the month and spends it all within a week. Parents concerned that patient will overdose at some point; reports patient has kids at home and they feel during the week a month patient is using drugs he is not caring for the children.   On evaluation, patient denies suicidal or homicidal ideations. He denies drug use, stating that he takes "1/2 of a xanex" when he can't sleep. He states that he does not have a drug problem. Patient states that he only uses drugs "only once a month." Discussed with patient even if it is "only once a month" this is detrimental to him. Writer presented that his children need him; patient agreed.  Patient states that he will "think about a rehab program. "  Patient initially gave a urine specimen that was obviously full of water. We will repeat.  Repeat UDS is positive for cocaine and benzos.   Past Psychiatric History: patient denies  Risk to Self:   Risk to Others:   Prior Inpatient Therapy:   Prior Outpatient Therapy:    Past Medical History:  Past Medical History:  Diagnosis Date   Back pain    chronic back pain   No past surgical history on file. Family History: No family history on file. Family Psychiatric  History: none reported  Social History:  Social History   Substance and Sexual Activity  Alcohol Use Yes     Social History   Substance and Sexual  Activity  Drug Use Never    Social History   Socioeconomic History   Marital status: Married    Spouse name: Not on file   Number of children: Not on file   Years of education: Not on file   Highest education level: Not on file  Occupational History   Not on file  Tobacco Use   Smoking status: Every Day   Smokeless tobacco: Current  Substance and Sexual Activity   Alcohol use: Yes   Drug use: Never   Sexual activity: Yes  Other Topics Concern   Not on file  Social History Narrative   Not on file   Social Determinants of Health   Financial Resource Strain: Not on file  Food Insecurity: Not on file  Transportation Needs: Not on file  Physical Activity: Not on file  Stress: Not on file  Social Connections: Not on file   Additional Social History:    Allergies:   Allergies  Allergen Reactions   Tramadol Itching and Rash    Labs:  Results for orders placed or performed during the hospital encounter of 09/27/21 (from the past 48 hour(s))  Comprehensive metabolic panel     Status: Abnormal   Collection Time: 09/27/21  2:07 PM  Result Value Ref Range   Sodium 140 135 - 145 mmol/L   Potassium 4.0 3.5 - 5.1 mmol/L   Chloride 104 98 - 111 mmol/L   CO2 27  22 - 32 mmol/L   Glucose, Bld 113 (H) 70 - 99 mg/dL    Comment: Glucose reference range applies only to samples taken after fasting for at least 8 hours.   BUN 20 6 - 20 mg/dL   Creatinine, Ser 2.84 0.61 - 1.24 mg/dL   Calcium 9.2 8.9 - 13.2 mg/dL   Total Protein 7.2 6.5 - 8.1 g/dL   Albumin 4.4 3.5 - 5.0 g/dL   AST 22 15 - 41 U/L   ALT 14 0 - 44 U/L   Alkaline Phosphatase 76 38 - 126 U/L   Total Bilirubin 0.5 0.3 - 1.2 mg/dL   GFR, Estimated >44 >01 mL/min    Comment: (NOTE) Calculated using the CKD-EPI Creatinine Equation (2021)    Anion gap 9 5 - 15    Comment: Performed at [facility], [address] [place], [place] [address]  Ethanol     Status: None   Collection Time: 09/27/21  2:07 PM   Result Value Ref Range   Alcohol, Ethyl (B) <10 <10 mg/dL    Comment: (NOTE) Lowest detectable limit for serum alcohol is 10 mg/dL.  For medical purposes only. Performed at [facility], [address] [place], [place] [address]   Salicylate level     Status: Abnormal   Collection Time: 09/27/21  2:07 PM  Result Value Ref Range   Salicylate Lvl <7.0 (L) 7.0 - 30.0 mg/dL    Comment: Performed at [facility], [address] [place], [place] [address]  Acetaminophen level     Status: Abnormal   Collection Time: 09/27/21  2:07 PM  Result Value Ref Range   Acetaminophen (Tylenol), Serum <10 (L) 10 - 30 ug/mL    Comment: (NOTE) Therapeutic concentrations vary significantly. A range of 10-30 ug/mL  may be an effective concentration for many patients. However, some  are best treated at concentrations outside of this range. Acetaminophen concentrations >150 ug/mL at 4 hours after ingestion  and >50 ug/mL at 12 hours after ingestion are often associated with  toxic reactions.  Performed at [facility], [address] [place], [place] [address]   cbc     Status: Abnormal   Collection Time: 09/27/21  2:07 PM  Result Value Ref Range   WBC 8.3 4.0 - 10.5 K/uL   RBC 4.41 4.22 - 5.81 MIL/uL   Hemoglobin 12.2 (L) 13.0 - 17.0 g/dL   HCT 38.7 (L) 56.4 - 33.2 %   MCV 85.9 80.0 - 100.0 fL   MCH 27.7 26.0 - 34.0 pg   MCHC 32.2 30.0 - 36.0 g/dL   RDW 95.1 88.4 - 16.6 %   Platelets 204 150 - 400 K/uL   nRBC 0.0 0.0 - 0.2 %    Comment: Performed at [facility], [address] [place], [place] [address]  Urine Drug Screen, Qualitative     Status: Abnormal   Collection Time: 09/27/21  3:16 PM  Result Value Ref Range   Tricyclic, Ur Screen NONE DETECTED NONE DETECTED   Amphetamines, Ur Screen NONE DETECTED NONE DETECTED   MDMA (Ecstasy)Ur Screen NONE DETECTED NONE DETECTED   Cocaine Metabolite,Ur [place] POSITIVE (A) NONE DETECTED   Opiate, Ur Screen  NONE DETECTED NONE DETECTED   Phencyclidine (PCP) Ur S NONE DETECTED NONE DETECTED   Cannabinoid 50 Ng, Ur  NONE DETECTED NONE DETECTED   Barbiturates, Ur Screen NONE DETECTED NONE DETECTED   Benzodiazepine, Ur Scrn POSITIVE (A) NONE DETECTED  Methadone Scn, Ur NONE DETECTED NONE DETECTED    Comment: (NOTE) Tricyclics + metabolites, urine    Cutoff 1000 ng/mL Amphetamines + metabolites, urine  Cutoff 1000 ng/mL MDMA (Ecstasy), urine              Cutoff 500 ng/mL Cocaine Metabolite, urine          Cutoff 300 ng/mL Opiate + metabolites, urine        Cutoff 300 ng/mL Phencyclidine (PCP), urine         Cutoff 25 ng/mL Cannabinoid, urine                 Cutoff 50 ng/mL Barbiturates + metabolites, urine  Cutoff 200 ng/mL Benzodiazepine, urine              Cutoff 200 ng/mL Methadone, urine                   Cutoff 300 ng/mL  The urine drug screen provides only a preliminary, unconfirmed analytical test result and should not be used for non-medical purposes. Clinical consideration and professional judgment should be applied to any positive drug screen result due to possible interfering substances. A more specific alternate chemical method must be used in order to obtain a confirmed analytical result. Gas chromatography / mass spectrometry (GC/MS) is the preferred confirm atory method. Performed at [facility], [address] [place] [address] [place], [place] [address]     No current facility-administered medications for this encounter.   No current outpatient medications on file.    Musculoskeletal: Strength & Muscle Tone: within normal limits Gait & Station: normal Patient leans: N/A   Psychiatric Specialty Exam:  Presentation  General Appearance: Appropriate for Environment  Eye Contact:Good  Speech:Clear and Coherent  Speech Volume:Normal  Handedness:No data recorded  Mood and Affect  Mood:Depressed  Affect:Blunt   Thought Process  Thought  Processes:Coherent  Descriptions of Associations:Intact  Orientation:No data recorded Thought Content:WDL  History of Schizophrenia/Schizoaffective disorder:No data recorded Duration of Psychotic Symptoms:No data recorded Hallucinations:Hallucinations: None  Ideas of Reference:None  Suicidal Thoughts:Suicidal Thoughts: No  Homicidal Thoughts:Homicidal Thoughts: No   Sensorium  Memory:Immediate Fair  Judgment:Poor  Insight:Poor   Executive Functions  Concentration:Fair  Attention Span:Fair  Recall:Fair  Fund of Knowledge:Fair  Language:Fair   Psychomotor Activity  Psychomotor Activity:Psychomotor Activity: Normal   Assets  Assets:Housing; Social Support; Resilience; Financial Resources/Insurance   Sleep  Sleep:Sleep: Fair   Physical Exam: Physical Exam Vitals and nursing note reviewed.  HENT:     Head: Normocephalic.     Nose: No congestion or rhinorrhea.  Eyes:     General:        Right eye: No discharge.        Left eye: No discharge.  Cardiovascular:     Rate and Rhythm: Normal rate.  Pulmonary:     Effort: Pulmonary effort is normal.  Musculoskeletal:        General: Normal range of motion.     Cervical back: Normal range of motion.  Neurological:     Mental Status: He is alert and oriented to person, place, and time.  Psychiatric:        Attention and Perception: Attention normal.        Mood and Affect: Affect is blunt.        Speech: Speech normal.        Behavior: Behavior is cooperative.        Thought Content: Thought content is not paranoid. Thought content does  not include homicidal or suicidal ideation.    Review of Systems  Constitutional: Negative.   Eyes: Negative.   Cardiovascular: Negative.   Skin:        Scratches on Rt. Forearm-pt reports from [place] he has a t home. Abrasion on forehead he states is from a fall.   Psychiatric/Behavioral:  Positive for depression (denies) and substance abuse. Negative for  hallucinations, memory loss and suicidal ideas. The patient is not nervous/anxious and does not have insomnia.    Blood pressure 131/84, pulse 97, temperature 98.5 F (36.9 C), temperature source Oral, resp. rate 18, height 5\' 9"  (1.753 m), weight 79.4 kg, SpO2 96 %. Body mass index is 25.85 kg/m.  Treatment Plan Summary: Plan refer to outpatient substance use program. Patient is not interested in inpatient rehab programs.He does not appear to be in imminent danger to himself or others. IVC released.    Disposition: No evidence of imminent risk to self or others at present.   Patient does not meet criteria for psychiatric inpatient admission. Supportive therapy provided about ongoing stressors. Discussed crisis plan, support from social network, calling 911, coming to the Emergency Department, and calling Suicide Hotline.  , NP 09/27/2021 4:52 PM

## 2021-09-27 NOTE — ED Notes (Addendum)
Pt belongings:  1 gray and white hat 1 gray long sleeved shirt  1 pair of gray sweat pants 1 pair of gray sneakers 1 pair of white socks 1 black wallet 1 blue and white bandana 1 silver necklace with dogtag
